# Patient Record
Sex: Male | Born: 1995
Health system: Southern US, Community
[De-identification: ages and names within clinical notes are randomized; demographics above are authoritative.]

## PROBLEM LIST (undated history)

## (undated) DIAGNOSIS — R011 Cardiac murmur, unspecified: Secondary | ICD-10-CM

## (undated) HISTORY — DX: Cardiac murmur, unspecified: R01.1

---
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
## 2016-01-09 ENCOUNTER — Ambulatory Visit
Admission: RE | Admit: 2016-01-09 | Discharge: 2016-01-09 | Disposition: A | Discharge status: Home / Self Care | Payer: Federal, State, Local not specified - PPO | Source: Ambulatory Visit | Attending: Rheumatology | Admitting: Rheumatology

## 2016-01-09 ENCOUNTER — Other Ambulatory Visit: Payer: Self-pay | Admitting: Rheumatology

## 2016-01-09 DIAGNOSIS — M5489 Other dorsalgia: Secondary | ICD-10-CM

## 2016-05-08 DIAGNOSIS — L905 Scar conditions and fibrosis of skin: Secondary | ICD-10-CM | POA: Diagnosis not present

## 2016-05-08 DIAGNOSIS — D485 Neoplasm of uncertain behavior of skin: Secondary | ICD-10-CM | POA: Diagnosis not present

## 2016-12-30 DIAGNOSIS — L814 Other melanin hyperpigmentation: Secondary | ICD-10-CM | POA: Diagnosis not present

## 2016-12-30 DIAGNOSIS — D225 Melanocytic nevi of trunk: Secondary | ICD-10-CM | POA: Diagnosis not present

## 2017-07-23 ENCOUNTER — Encounter: Payer: Self-pay | Admitting: Family Medicine

## 2017-07-23 ENCOUNTER — Ambulatory Visit: Payer: Self-pay

## 2017-07-23 ENCOUNTER — Ambulatory Visit: Payer: Federal, State, Local not specified - PPO | Admitting: Family Medicine

## 2017-07-23 ENCOUNTER — Other Ambulatory Visit: Payer: Self-pay

## 2017-07-23 VITALS — BP 114/80 | HR 70 | Ht 71.0 in | Wt 132.0 lb

## 2017-07-23 DIAGNOSIS — M7671 Peroneal tendinitis, right leg: Secondary | ICD-10-CM | POA: Diagnosis not present

## 2017-07-23 DIAGNOSIS — M2142 Flat foot [pes planus] (acquired), left foot: Secondary | ICD-10-CM | POA: Diagnosis not present

## 2017-07-23 DIAGNOSIS — G8929 Other chronic pain: Secondary | ICD-10-CM

## 2017-07-23 DIAGNOSIS — M2141 Flat foot [pes planus] (acquired), right foot: Secondary | ICD-10-CM | POA: Diagnosis not present

## 2017-07-23 DIAGNOSIS — M2140 Flat foot [pes planus] (acquired), unspecified foot: Secondary | ICD-10-CM | POA: Insufficient documentation

## 2017-07-23 DIAGNOSIS — M25571 Pain in right ankle and joints of right foot: Secondary | ICD-10-CM | POA: Diagnosis not present

## 2017-07-23 DIAGNOSIS — S92353A Displaced fracture of fifth metatarsal bone, unspecified foot, initial encounter for closed fracture: Secondary | ICD-10-CM | POA: Insufficient documentation

## 2017-07-23 DIAGNOSIS — S92351S Displaced fracture of fifth metatarsal bone, right foot, sequela: Secondary | ICD-10-CM | POA: Diagnosis not present

## 2017-07-23 MED ORDER — DICLOFENAC SODIUM 2 % TD SOLN: 2.0000 g | Freq: Two times a day (BID) | TRANSDERMAL | 3 refills | Status: AC

## 2017-07-23 NOTE — Patient Instructions (Signed)
[name].  Ice 20 minutes 2 times daily. Usually after activity and before bed. Exercises 3 times a week.  pennsaid pinkie amount topically 2 times daily as needed.  Over the counter get  Vitamin D 4000 Iu daily for 4 weeks [email or web address] size medium in x-linked ankle brace  Good shoes with rigid bottom.  John Gallagher, [employer], [place] or New balance greater then 700 See me again in 4 weeks or send me message

## 2017-07-23 NOTE — Assessment & Plan Note (Signed)
Right-sided peroneal tendinitis.  We discussed icing regimen and home exercises, we discussed which activities of doing which wants to avoid.  Patient will get a compression brace, topical anti-inflammatories prescribed.  Work with Event organiserathletic trainer to learn home exercises in greater detail.  Follow-up again in 4 weeks

## 2017-07-23 NOTE — Progress Notes (Signed)
Tawana ScaleZach Smith D.O. [facility] [address] [place], [place] [address] Phone: [phone] Subjective:    I'm seeing this patient by the request  of:    CC: Right ankle pain  BJY:[id number]:Subjective  John BodySteven D Gallagher is a 22 y.o. male coming in with complaint of right ankle pain.  Had an injury to his right foot greater than a year ago.  Patient did have x-rays at that time, on August 13, 2015 there were independently visualized by me showing a nondisplaced fracture of the base of the fifth metatarsal. Patient states his ankle pops a lot.  Onset- Chronic Location- Lateral ankle, base of fifth, peroneal Character- Sharp Aggravating factors- Pain after activity, running  Reliving factors- not working out.  Severity-5/10     History reviewed. No pertinent past medical history. History reviewed. No pertinent surgical history. Social History   Socioeconomic History  . Marital status: Single    Spouse name: None  . Number of children: None  . Years of education: None  . Highest education level: None  Social Needs  . Financial resource strain: None  . Food insecurity - worry: None  . Food insecurity - inability: None  . Transportation needs - medical: None  . Transportation needs - non-medical: None  Occupational History  . None  Tobacco Use  . Smoking status: Never Smoker  . Smokeless tobacco: Never Used  Substance and Sexual Activity  . Alcohol use: None  . Drug use: None  . Sexual activity: None  Other Topics Concern  . None  Social History Narrative  . None   Not on File History reviewed. No pertinent family history.  No family history of autoimmune   Past medical history, social, surgical and family history all reviewed in electronic medical record.  No pertanent information unless stated regarding to the chief complaint.   Review of Systems:Review of systems updated and as accurate as of 07/23/17  No headache, visual changes, nausea, vomiting, diarrhea,  constipation, dizziness, abdominal pain, skin rash, fevers, chills, night sweats, weight loss, swollen lymph nodes, Gallagher aches, joint swelling, chest pain, shortness of breath, mood changes.  Positive muscle aches  Objective  Blood pressure 114/80, pulse 70, height 5\' 11"  (1.803 m), weight 132 lb (59.9 kg), SpO2 99 %. Systems examined below as of 07/23/17   General: No apparent distress alert and oriented x3 mood and affect normal, dressed appropriately.  HEENT: Pupils equal, extraocular movements intact  Respiratory: Patient's speak in full sentences and does not appear short of breath  Cardiovascular: No lower extremity edema, non tender, no erythema  Skin: Warm dry intact with no signs of infection or rash on extremities or on axial skeleton.  Abdomen: Soft nontender  Neuro: Cranial nerves II through XII are intact, neurovascularly intact in all extremities with 2+ DTRs and 2+ pulses.  Lymph: No lymphadenopathy of posterior or anterior cervical chain or axillae bilaterally.  Gait normal with good balance and coordination.  MSK:  Non tender with full range of motion and good stability and symmetric strength and tone of shoulders, elbows, wrist, hip, knee bilaterally.  Ankle: Right No visible erythema or swelling. Range of motion is full in all directions. Strength is 5/5 in all directions. Stable lateral and medial ligaments; squeeze test and kleiger test unremarkable; Talar dome nontender; No pain at base of 5th MT; No tenderness over cuboid; No tenderness over N spot or navicular prominence No tenderness on posterior aspects of lateral and medial malleolus No sign  of peroneal tendon subluxations or tenderness to palpation Negative tarsal tunnel tinel's Able to walk 4 steps. Mild pes planus with over pronation.  MSK US performed of: Right ankle This study was ordered, performed, and interpreted by Terrilee Files D.O.  Foot/Ankle:   All structures visualized.   Talar dome  unremarkable  Ankle mortise without effusion. Peroneus longus and brevis tendons patient does show what seems to be more of a chronic subluxation.  Hypoechoic changes noted in the area. Posterior tibialis, flexor hallucis longus, and flexor digitorum longus tendons unremarkable on long and transverse views without sheath effusions. Achilles tendon visualized along length of tendon and unremarkable on long and transverse views without sheath effusion. Anterior Talofibular Ligament and Calcaneofibular Ligaments unremarkable and intact. Deltoid Ligament unremarkable and intact. Plantar fascia intact and without effusion, normal thickness. No increased doppler signal, cap sign, or thickening of tibial cortex. Power doppler signal normal. Fifth metatarsal going into the fourth interphalangeal space does have what appears to be a small callus formation still the area.  IMPRESSION: Perennial subluxation and possibly metatarsal fracture slow or delayed healing Jones fracture  [id number]; 15 additional minutes spent for Therapeutic exercises as stated in above notes.  This included exercises focusing on stretching, strengthening, with significant focus on eccentric aspects.   Long term goals include an improvement in range of motion, strength, endurance as well as avoiding reinjury. Patient's frequency would include in 1-2 times a day, 3-5 times a week for a duration of 6-12 weeks.  Ankle strengthening that included:  Basic range of motion exercises to allow proper full motion at ankle Stretching of the lower leg and hamstrings  Theraband exercises for the lower leg - inversion, eversion, dorsiflexion and plantarflexion each to be completed with a theraband which was given  Balance exercises to increase proprioception Weight bearing exercises to increase strength and balance Proper technique shown and discussed handout in great detail with ATC.  All questions were discussed and answered.      Impression  and Recommendations:     This case required medical decision making of moderate complexity.      Note: This dictation was prepared with Dragon dictation along with smaller phrase technology. Any transcriptional errors that result from this process are unintentional.

## 2017-07-28 ENCOUNTER — Encounter: Payer: Self-pay | Admitting: Family Medicine

## 2017-07-28 ENCOUNTER — Ambulatory Visit: Payer: Federal, State, Local not specified - PPO | Admitting: Family Medicine

## 2017-07-28 VITALS — BP 104/64 | HR 74 | Temp 97.7°F | Ht 70.0 in | Wt 127.4 lb

## 2017-07-28 DIAGNOSIS — Z0000 Encounter for general adult medical examination without abnormal findings: Secondary | ICD-10-CM | POA: Insufficient documentation

## 2017-07-28 LAB — LIPID PANEL
CHOLESTEROL: 139 mg/dL (ref 0–200)
HDL: 65.7 mg/dL (ref >39.00)
LDL Cholesterol: 61 mg/dL (ref 0–99)
NonHDL: 73.34
Total CHOL/HDL Ratio: 2
Triglycerides: 61 mg/dL (ref 0.0–149.0)
VLDL: 12.2 mg/dL (ref 0.0–40.0)

## 2017-07-28 LAB — COMPREHENSIVE METABOLIC PANEL
ALBUMIN: 5.1 g/dL (ref 3.5–5.2)
ALK PHOS: 80 U/L (ref 39–117)
ALT: 15 U/L (ref 0–53)
AST: 16 U/L (ref 0–37)
BILIRUBIN TOTAL: 1.1 mg/dL (ref 0.2–1.2)
BUN: 21 mg/dL (ref 6–23)
CO2: 31 mEq/L (ref 19–32)
Calcium: 10.2 mg/dL (ref 8.4–10.5)
Chloride: 101 mEq/L (ref 96–112)
Creatinine, Ser: 1.12 mg/dL (ref 0.40–1.50)
GFR: 88 mL/min (ref >60.00)
Glucose, Bld: 86 mg/dL (ref 70–99)
Potassium: 4.3 mEq/L (ref 3.5–5.1)
Sodium: 142 mEq/L (ref 135–145)
TOTAL PROTEIN: 7.8 g/dL (ref 6.0–8.3)

## 2017-07-28 NOTE — Progress Notes (Signed)
Subjective:   Patient ID: John Gallagher, male    DOB: Apr 25, 1996, 22 y.o.   MRN: [id number]  John Gallagher is a pleasant 22 y.o. year old male who presents to clinic today with Establish Care (Patient is here today to establish care.  He states that he had quite a few immunizations 3-4 years ago when he went to [facility] and will check and get a copy of those to [place].  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  Has been seeing Dr. Katrinka Blazing for chronic foot and leg pain after multiple fractures.  On Pennsaid and Vit D which have been working well.  Archivist at [employer]  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 Lives off campus.  Not sexually active.  Does not drink or use drugs.    Current Outpatient Medications on File Prior to Visit  Medication Sig Dispense Refill  . Cholecalciferol (HM VITAMIN D3) 4000 units CAPS Take by mouth.    . Cholecalciferol (VITAMIN D) 2000 units CAPS Take 2 capsules by mouth daily.    . Diclofenac Sodium (PENNSAID) 2 % SOLN Place 2 g onto the skin 2 (two) times daily. (Patient not taking: Reported on 07/28/2017) 112 g 3   No current facility-administered medications on file prior to visit.     Not on File  Past Medical History:  Diagnosis Date  . Heart murmur     No past surgical history on file.  Family History  Problem Relation Age of Onset  . Cancer Maternal Grandmother        Breast  . Hearing loss Maternal Grandmother   . Cancer Maternal Grandfather        Lung-non smoker  . Heart attack Maternal Grandfather   . Heart disease Maternal Grandfather   . Hyperlipidemia Maternal Grandfather   . Cancer Paternal Grandmother   . Early death Paternal Grandmother   . Heart disease Paternal Grandmother   . Cancer Paternal Grandfather        Lung-Smoker    Social History   Socioeconomic History  . Marital status: Single    Spouse name: Not on file  . Number of children: Not on file  . Years of education: Not on  file  . Highest education level: Not on file  Social Needs  . Financial resource strain: Not on file  . Food insecurity - worry: Not on file  . Food insecurity - inability: Not on file  . Transportation needs - medical: Not on file  . Transportation needs - non-medical: Not on file  Occupational History  . Not on file  Tobacco Use  . Smoking status: Never Smoker  . Smokeless tobacco: Never Used  Substance and Sexual Activity  . Alcohol use: Not on file  . Drug use: Not on file  . Sexual activity: Not on file  Other Topics Concern  . Not on file  Social History Narrative  . Not on file   The PMH, PSH, Social History, Family History, Medications, and allergies have been reviewed in [facility], and have been updated if relevant.   Review of Systems  Constitutional: Negative.   HENT: Negative.   Eyes: Negative.   Respiratory: Negative.   Cardiovascular: Negative.   Gastrointestinal: Negative.   Endocrine: Negative.   Genitourinary: Negative.   Musculoskeletal: Negative.   Skin: Negative.   Allergic/Immunologic: Negative.   Neurological: Negative.   Hematological: Negative.   Psychiatric/Behavioral: Negative.  All other systems reviewed and are negative.      Objective:    BP 104/64 (BP Location: Left Arm, Patient Position: Sitting, Cuff Size: Normal)   Pulse 74   Temp 97.7 F (36.5 C) (Oral)   Ht 5\' 10"  (1.778 m)   Wt 127 lb 6.4 oz (57.8 kg)   SpO2 97%   BMI 18.28 kg/m    Physical Exam  Constitutional: He is oriented to person, place, and time. He appears well-developed and well-nourished. No distress.  HENT:  Head: Normocephalic and atraumatic.  Eyes: Conjunctivae are normal.  Neck: Normal range of motion.  Cardiovascular: Normal rate, regular rhythm and normal heart sounds.  No murmur heard. Pulmonary/Chest: Effort normal and breath sounds normal.  Abdominal: Soft.  Musculoskeletal: Normal range of motion. He exhibits no edema.  Neurological: He is alert  and oriented to person, place, and time. No cranial nerve deficit.  Skin: Skin is warm and dry. He is not diaphoretic.  Psychiatric: He has a normal mood and affect. His behavior is normal. Judgment and thought content normal.  Nursing note and vitals reviewed.         Assessment & Plan:   Visit for well man health check - Plan: Comprehensive metabolic panel, Lipid panel No Follow-up on file.

## 2017-07-28 NOTE — Assessment & Plan Note (Signed)
Reviewed preventive care protocols, scheduled due services, and updated immunizations Discussed nutrition, exercise, diet, and healthy lifestyle.  Orders Placed This Encounter  Procedures  . Comprehensive metabolic panel  . Lipid panel    

## 2017-07-28 NOTE — Patient Instructions (Signed)
It was so great to meet you!  Happy Birthday!!  We will call you with your lab results and you can view them online.

## 2018-01-15 DIAGNOSIS — M9902 Segmental and somatic dysfunction of thoracic region: Secondary | ICD-10-CM | POA: Diagnosis not present

## 2018-01-15 DIAGNOSIS — M9901 Segmental and somatic dysfunction of cervical region: Secondary | ICD-10-CM | POA: Diagnosis not present

## 2018-01-15 DIAGNOSIS — M9903 Segmental and somatic dysfunction of lumbar region: Secondary | ICD-10-CM | POA: Diagnosis not present

## 2018-01-15 DIAGNOSIS — M5032 Other cervical disc degeneration, mid-cervical region, unspecified level: Secondary | ICD-10-CM | POA: Diagnosis not present

## 2018-01-16 DIAGNOSIS — M5032 Other cervical disc degeneration, mid-cervical region, unspecified level: Secondary | ICD-10-CM | POA: Diagnosis not present

## 2018-01-16 DIAGNOSIS — M9903 Segmental and somatic dysfunction of lumbar region: Secondary | ICD-10-CM | POA: Diagnosis not present

## 2018-01-16 DIAGNOSIS — M9901 Segmental and somatic dysfunction of cervical region: Secondary | ICD-10-CM | POA: Diagnosis not present

## 2018-01-16 DIAGNOSIS — M9902 Segmental and somatic dysfunction of thoracic region: Secondary | ICD-10-CM | POA: Diagnosis not present

## 2018-01-19 DIAGNOSIS — M5032 Other cervical disc degeneration, mid-cervical region, unspecified level: Secondary | ICD-10-CM | POA: Diagnosis not present

## 2018-01-19 DIAGNOSIS — M9902 Segmental and somatic dysfunction of thoracic region: Secondary | ICD-10-CM | POA: Diagnosis not present

## 2018-01-19 DIAGNOSIS — M9901 Segmental and somatic dysfunction of cervical region: Secondary | ICD-10-CM | POA: Diagnosis not present

## 2018-01-19 DIAGNOSIS — M9903 Segmental and somatic dysfunction of lumbar region: Secondary | ICD-10-CM | POA: Diagnosis not present

## 2018-01-24 DIAGNOSIS — M9902 Segmental and somatic dysfunction of thoracic region: Secondary | ICD-10-CM | POA: Diagnosis not present

## 2018-01-24 DIAGNOSIS — M9901 Segmental and somatic dysfunction of cervical region: Secondary | ICD-10-CM | POA: Diagnosis not present

## 2018-01-24 DIAGNOSIS — M5032 Other cervical disc degeneration, mid-cervical region, unspecified level: Secondary | ICD-10-CM | POA: Diagnosis not present

## 2018-01-24 DIAGNOSIS — M9903 Segmental and somatic dysfunction of lumbar region: Secondary | ICD-10-CM | POA: Diagnosis not present

## 2018-01-31 DIAGNOSIS — M5032 Other cervical disc degeneration, mid-cervical region, unspecified level: Secondary | ICD-10-CM | POA: Diagnosis not present

## 2018-01-31 DIAGNOSIS — M9902 Segmental and somatic dysfunction of thoracic region: Secondary | ICD-10-CM | POA: Diagnosis not present

## 2018-01-31 DIAGNOSIS — M9901 Segmental and somatic dysfunction of cervical region: Secondary | ICD-10-CM | POA: Diagnosis not present

## 2018-01-31 DIAGNOSIS — M9903 Segmental and somatic dysfunction of lumbar region: Secondary | ICD-10-CM | POA: Diagnosis not present

## 2018-02-07 DIAGNOSIS — M9902 Segmental and somatic dysfunction of thoracic region: Secondary | ICD-10-CM | POA: Diagnosis not present

## 2018-02-07 DIAGNOSIS — M5032 Other cervical disc degeneration, mid-cervical region, unspecified level: Secondary | ICD-10-CM | POA: Diagnosis not present

## 2018-02-07 DIAGNOSIS — M9903 Segmental and somatic dysfunction of lumbar region: Secondary | ICD-10-CM | POA: Diagnosis not present

## 2018-02-07 DIAGNOSIS — M9901 Segmental and somatic dysfunction of cervical region: Secondary | ICD-10-CM | POA: Diagnosis not present

## 2018-02-14 DIAGNOSIS — M5032 Other cervical disc degeneration, mid-cervical region, unspecified level: Secondary | ICD-10-CM | POA: Diagnosis not present

## 2018-02-14 DIAGNOSIS — M9902 Segmental and somatic dysfunction of thoracic region: Secondary | ICD-10-CM | POA: Diagnosis not present

## 2018-02-14 DIAGNOSIS — M9903 Segmental and somatic dysfunction of lumbar region: Secondary | ICD-10-CM | POA: Diagnosis not present

## 2018-02-14 DIAGNOSIS — M9901 Segmental and somatic dysfunction of cervical region: Secondary | ICD-10-CM | POA: Diagnosis not present

## 2018-02-21 DIAGNOSIS — M9901 Segmental and somatic dysfunction of cervical region: Secondary | ICD-10-CM | POA: Diagnosis not present

## 2018-02-21 DIAGNOSIS — M9903 Segmental and somatic dysfunction of lumbar region: Secondary | ICD-10-CM | POA: Diagnosis not present

## 2018-02-21 DIAGNOSIS — M9902 Segmental and somatic dysfunction of thoracic region: Secondary | ICD-10-CM | POA: Diagnosis not present

## 2018-02-21 DIAGNOSIS — M5032 Other cervical disc degeneration, mid-cervical region, unspecified level: Secondary | ICD-10-CM | POA: Diagnosis not present

## 2018-02-28 DIAGNOSIS — M9902 Segmental and somatic dysfunction of thoracic region: Secondary | ICD-10-CM | POA: Diagnosis not present

## 2018-02-28 DIAGNOSIS — M5032 Other cervical disc degeneration, mid-cervical region, unspecified level: Secondary | ICD-10-CM | POA: Diagnosis not present

## 2018-02-28 DIAGNOSIS — M9903 Segmental and somatic dysfunction of lumbar region: Secondary | ICD-10-CM | POA: Diagnosis not present

## 2018-02-28 DIAGNOSIS — M9901 Segmental and somatic dysfunction of cervical region: Secondary | ICD-10-CM | POA: Diagnosis not present

## 2018-11-09 ENCOUNTER — Telehealth: Payer: Self-pay | Admitting: Family Medicine

## 2018-11-09 NOTE — Telephone Encounter (Signed)
Tried to call patient on behalf of Dr Dayton Martes because it has been a while, left message making sure patient is aware we are offering virtual visits

## 2018-12-29 DIAGNOSIS — D1801 Hemangioma of skin and subcutaneous tissue: Secondary | ICD-10-CM | POA: Diagnosis not present

## 2018-12-29 DIAGNOSIS — D229 Melanocytic nevi, unspecified: Secondary | ICD-10-CM | POA: Diagnosis not present

## 2018-12-29 DIAGNOSIS — L821 Other seborrheic keratosis: Secondary | ICD-10-CM | POA: Diagnosis not present

## 2018-12-29 DIAGNOSIS — L814 Other melanin hyperpigmentation: Secondary | ICD-10-CM | POA: Diagnosis not present

## 2019-08-04 DIAGNOSIS — M791 Myalgia, unspecified site: Secondary | ICD-10-CM | POA: Diagnosis not present

## 2020-02-28 DIAGNOSIS — Z20828 Contact with and (suspected) exposure to other viral communicable diseases: Secondary | ICD-10-CM | POA: Diagnosis not present

## 2020-03-07 DIAGNOSIS — Z23 Encounter for immunization: Secondary | ICD-10-CM | POA: Diagnosis not present

## 2020-03-29 DIAGNOSIS — Z23 Encounter for immunization: Secondary | ICD-10-CM | POA: Diagnosis not present
